# Patient Record
Sex: Female | Born: 1950 | Race: Black or African American | Hispanic: No | Marital: Married | Smoking: Former smoker
Health system: Southern US, Community
[De-identification: ages and names within clinical notes are randomized; demographics above are authoritative.]

## PROBLEM LIST (undated history)

## (undated) DIAGNOSIS — E119 Type 2 diabetes mellitus without complications: Secondary | ICD-10-CM

## (undated) DIAGNOSIS — I10 Essential (primary) hypertension: Secondary | ICD-10-CM

## (undated) HISTORY — PX: BREAST REDUCTION SURGERY: SHX8

## (undated) HISTORY — PX: ABDOMINAL HYSTERECTOMY: SHX81

## (undated) HISTORY — PX: BACK SURGERY: SHX140

## (undated) HISTORY — PX: OTHER SURGICAL HISTORY: SHX169

---

## 1998-09-23 ENCOUNTER — Encounter: Payer: Self-pay | Admitting: Family Medicine

## 1998-09-23 ENCOUNTER — Ambulatory Visit (HOSPITAL_COMMUNITY): Admission: RE | Admit: 1998-09-23 | Discharge: 1998-09-23 | Payer: Self-pay | Admitting: Family Medicine

## 1999-09-08 ENCOUNTER — Encounter: Payer: Self-pay | Admitting: Internal Medicine

## 1999-09-08 ENCOUNTER — Encounter: Admission: RE | Admit: 1999-09-08 | Discharge: 1999-09-08 | Payer: Self-pay | Admitting: Internal Medicine

## 1999-10-05 ENCOUNTER — Other Ambulatory Visit: Admission: RE | Admit: 1999-10-05 | Discharge: 1999-10-05 | Payer: Self-pay | Admitting: Internal Medicine

## 1999-11-03 ENCOUNTER — Ambulatory Visit (HOSPITAL_COMMUNITY): Admission: RE | Admit: 1999-11-03 | Discharge: 1999-11-03 | Payer: Self-pay | Admitting: Gastroenterology

## 1999-11-09 ENCOUNTER — Encounter: Admission: RE | Admit: 1999-11-09 | Discharge: 1999-11-09 | Payer: Self-pay | Admitting: Internal Medicine

## 1999-11-09 ENCOUNTER — Encounter: Payer: Self-pay | Admitting: Internal Medicine

## 2001-06-04 ENCOUNTER — Other Ambulatory Visit: Admission: RE | Admit: 2001-06-04 | Discharge: 2001-06-04 | Payer: Self-pay | Admitting: Family Medicine

## 2001-06-19 ENCOUNTER — Other Ambulatory Visit: Admission: RE | Admit: 2001-06-19 | Discharge: 2001-06-19 | Payer: Self-pay | Admitting: Gynecology

## 2002-12-26 ENCOUNTER — Other Ambulatory Visit: Admission: RE | Admit: 2002-12-26 | Discharge: 2002-12-26 | Payer: Self-pay | Admitting: Obstetrics and Gynecology

## 2003-02-06 ENCOUNTER — Observation Stay (HOSPITAL_COMMUNITY): Admission: RE | Admit: 2003-02-06 | Discharge: 2003-02-07 | Payer: Self-pay | Admitting: Obstetrics and Gynecology

## 2003-11-14 ENCOUNTER — Ambulatory Visit (HOSPITAL_COMMUNITY): Admission: RE | Admit: 2003-11-14 | Discharge: 2003-11-14 | Payer: Self-pay | Admitting: Family Medicine

## 2003-11-21 ENCOUNTER — Ambulatory Visit (HOSPITAL_COMMUNITY): Admission: RE | Admit: 2003-11-21 | Discharge: 2003-11-21 | Payer: Self-pay | Admitting: Family Medicine

## 2003-11-28 ENCOUNTER — Ambulatory Visit (HOSPITAL_COMMUNITY): Admission: RE | Admit: 2003-11-28 | Discharge: 2003-11-28 | Payer: Self-pay | Admitting: Family Medicine

## 2004-05-27 ENCOUNTER — Encounter: Admission: RE | Admit: 2004-05-27 | Discharge: 2004-05-27 | Payer: Self-pay | Admitting: Occupational Medicine

## 2004-06-15 ENCOUNTER — Emergency Department (HOSPITAL_COMMUNITY): Admission: EM | Admit: 2004-06-15 | Discharge: 2004-06-15 | Payer: Self-pay | Admitting: Emergency Medicine

## 2004-06-19 ENCOUNTER — Emergency Department (HOSPITAL_COMMUNITY): Admission: EM | Admit: 2004-06-19 | Discharge: 2004-06-19 | Payer: Self-pay | Admitting: Family Medicine

## 2004-09-07 ENCOUNTER — Ambulatory Visit (HOSPITAL_COMMUNITY): Admission: RE | Admit: 2004-09-07 | Discharge: 2004-09-08 | Payer: Self-pay | Admitting: Neurological Surgery

## 2005-01-18 ENCOUNTER — Encounter: Payer: Self-pay | Admitting: Neurological Surgery

## 2005-02-07 ENCOUNTER — Encounter: Payer: Self-pay | Admitting: Neurological Surgery

## 2005-03-10 ENCOUNTER — Encounter: Payer: Self-pay | Admitting: Neurological Surgery

## 2005-04-07 ENCOUNTER — Encounter: Payer: Self-pay | Admitting: Neurological Surgery

## 2006-01-19 ENCOUNTER — Encounter: Admission: RE | Admit: 2006-01-19 | Discharge: 2006-01-19 | Payer: Self-pay | Admitting: Family Medicine

## 2010-02-27 ENCOUNTER — Encounter: Payer: Self-pay | Admitting: Family Medicine

## 2010-02-28 ENCOUNTER — Encounter: Payer: Self-pay | Admitting: Neurological Surgery

## 2010-03-01 ENCOUNTER — Other Ambulatory Visit (HOSPITAL_COMMUNITY): Payer: Self-pay | Admitting: Cardiology

## 2010-03-19 ENCOUNTER — Encounter (HOSPITAL_COMMUNITY): Payer: Self-pay

## 2010-04-08 ENCOUNTER — Other Ambulatory Visit (HOSPITAL_COMMUNITY): Payer: Self-pay | Admitting: Cardiology

## 2010-04-29 ENCOUNTER — Other Ambulatory Visit (HOSPITAL_COMMUNITY): Payer: Self-pay

## 2010-04-30 ENCOUNTER — Other Ambulatory Visit (HOSPITAL_COMMUNITY): Payer: Self-pay | Admitting: Cardiology

## 2010-05-28 ENCOUNTER — Ambulatory Visit (HOSPITAL_COMMUNITY): Payer: Self-pay

## 2010-05-28 ENCOUNTER — Ambulatory Visit (HOSPITAL_COMMUNITY)
Admission: RE | Admit: 2010-05-28 | Discharge: 2010-05-28 | Disposition: A | Discharge status: Home / Self Care | Payer: Medicare Other | Source: Ambulatory Visit | Attending: Cardiology | Admitting: Cardiology

## 2010-05-28 ENCOUNTER — Encounter (HOSPITAL_COMMUNITY)
Admission: RE | Admit: 2010-05-28 | Discharge: 2010-05-28 | Disposition: A | Discharge status: Home / Self Care | Payer: Medicare Other | Source: Ambulatory Visit | Attending: Cardiology | Admitting: Cardiology

## 2010-05-28 DIAGNOSIS — I4949 Other premature depolarization: Secondary | ICD-10-CM | POA: Insufficient documentation

## 2010-05-28 DIAGNOSIS — I498 Other specified cardiac arrhythmias: Secondary | ICD-10-CM | POA: Insufficient documentation

## 2010-05-28 DIAGNOSIS — R0602 Shortness of breath: Secondary | ICD-10-CM | POA: Insufficient documentation

## 2010-05-28 DIAGNOSIS — R002 Palpitations: Secondary | ICD-10-CM | POA: Insufficient documentation

## 2010-05-28 DIAGNOSIS — R079 Chest pain, unspecified: Secondary | ICD-10-CM | POA: Insufficient documentation

## 2010-05-28 DIAGNOSIS — I4510 Unspecified right bundle-branch block: Secondary | ICD-10-CM | POA: Insufficient documentation

## 2010-05-28 LAB — HEPATIC FUNCTION PANEL
ALT: 26 U/L (ref 0–35)
Bilirubin, Direct: 0.5 mg/dL — ABNORMAL HIGH (ref 0.0–0.3)
Indirect Bilirubin: 0.2 mg/dL — ABNORMAL LOW (ref 0.3–0.9)
Total Protein: 7.3 g/dL (ref 6.0–8.3)

## 2010-05-28 LAB — LIPID PANEL
HDL: 32 mg/dL — ABNORMAL LOW (ref >39)
LDL Cholesterol: 70 mg/dL (ref 0–99)
Triglycerides: 141 mg/dL (ref <150)

## 2010-05-28 LAB — BASIC METABOLIC PANEL
Creatinine, Ser: 0.65 mg/dL (ref 0.4–1.2)
GFR calc non Af Amer: >60 mL/min (ref >60)
Glucose, Bld: 132 mg/dL — ABNORMAL HIGH (ref 70–99)

## 2010-05-28 LAB — MAGNESIUM: Magnesium: 2.1 mg/dL (ref 1.5–2.5)

## 2010-05-28 MED ORDER — TECHNETIUM TC 99M TETROFOSMIN IV KIT
30.0000 | PACK | Freq: Once | INTRAVENOUS | Status: AC | PRN
  Administered 2010-05-28: 30 via INTRAVENOUS

## 2010-05-28 MED ORDER — TECHNETIUM TC 99M TETROFOSMIN IV KIT
10.0000 | PACK | Freq: Once | INTRAVENOUS | Status: AC | PRN
  Administered 2010-05-28: 10 via INTRAVENOUS

## 2011-02-12 ENCOUNTER — Other Ambulatory Visit: Payer: Self-pay | Admitting: Cardiology

## 2013-08-06 ENCOUNTER — Emergency Department (HOSPITAL_COMMUNITY): Payer: No Typology Code available for payment source

## 2013-08-06 ENCOUNTER — Encounter (HOSPITAL_COMMUNITY): Payer: Self-pay | Admitting: Emergency Medicine

## 2013-08-06 ENCOUNTER — Emergency Department (HOSPITAL_COMMUNITY)
Admission: EM | Admit: 2013-08-06 | Discharge: 2013-08-06 | Disposition: A | Discharge status: Home / Self Care | Payer: No Typology Code available for payment source | Attending: Emergency Medicine | Admitting: Emergency Medicine

## 2013-08-06 DIAGNOSIS — M545 Low back pain: Secondary | ICD-10-CM

## 2013-08-06 DIAGNOSIS — S139XXA Sprain of joints and ligaments of unspecified parts of neck, initial encounter: Secondary | ICD-10-CM | POA: Insufficient documentation

## 2013-08-06 DIAGNOSIS — Y92410 Unspecified street and highway as the place of occurrence of the external cause: Secondary | ICD-10-CM | POA: Insufficient documentation

## 2013-08-06 DIAGNOSIS — R51 Headache: Secondary | ICD-10-CM | POA: Insufficient documentation

## 2013-08-06 DIAGNOSIS — E119 Type 2 diabetes mellitus without complications: Secondary | ICD-10-CM | POA: Insufficient documentation

## 2013-08-06 DIAGNOSIS — Z87891 Personal history of nicotine dependence: Secondary | ICD-10-CM | POA: Insufficient documentation

## 2013-08-06 DIAGNOSIS — Y9389 Activity, other specified: Secondary | ICD-10-CM | POA: Insufficient documentation

## 2013-08-06 DIAGNOSIS — I10 Essential (primary) hypertension: Secondary | ICD-10-CM | POA: Insufficient documentation

## 2013-08-06 DIAGNOSIS — IMO0002 Reserved for concepts with insufficient information to code with codable children: Secondary | ICD-10-CM | POA: Insufficient documentation

## 2013-08-06 HISTORY — DX: Essential (primary) hypertension: I10

## 2013-08-06 HISTORY — DX: Type 2 diabetes mellitus without complications: E11.9

## 2013-08-06 MED ORDER — OXYCODONE-ACETAMINOPHEN 5-325 MG PO TABS: 1.0000 | ORAL_TABLET | Freq: Four times a day (QID) | ORAL | Status: AC | PRN

## 2013-08-06 MED ORDER — OXYCODONE-ACETAMINOPHEN 5-325 MG PO TABS
2.0000 | ORAL_TABLET | Freq: Once | ORAL | Status: AC
  Administered 2013-08-06: 2 via ORAL
  Filled 2013-08-06: qty 2

## 2013-08-06 MED ORDER — ONDANSETRON HCL 4 MG PO TABS: 4.0000 mg | ORAL_TABLET | Freq: Four times a day (QID) | ORAL | Status: DC

## 2013-08-06 MED ORDER — ONDANSETRON 8 MG PO TBDP
8.0000 mg | ORAL_TABLET | Freq: Once | ORAL | Status: AC
  Administered 2013-08-06: 8 mg via ORAL
  Filled 2013-08-06: qty 1
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
## 2013-08-06 NOTE — ED Notes (Signed)
Pt on backboard on arrival to ED.
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
## 2013-08-06 NOTE — ED Notes (Signed)
Notified Dr. Lynelle DoctorKnapp, pt on spine board.  Per pt hx, waiting for MD to remove. Pt notified of delay and plan

## 2013-08-06 NOTE — ED Notes (Signed)
Pt able to ambulate prior to dc

## 2013-08-08 NOTE — ED Provider Notes (Signed)
Medical screening examination/treatment/procedure(s) were performed by non-physician practitioner and as supervising physician I was immediately available for consultation/collaboration.   EKG Interpretation None       Ankit Nanavati, MD 08/08/13 0646 

## 2014-02-24 ENCOUNTER — Emergency Department (HOSPITAL_COMMUNITY)
Admission: EM | Admit: 2014-02-24 | Discharge: 2014-02-25 | Payer: Medicare Other | Attending: Emergency Medicine | Admitting: Emergency Medicine

## 2014-02-24 ENCOUNTER — Encounter (HOSPITAL_COMMUNITY): Payer: Self-pay | Admitting: Emergency Medicine

## 2014-02-24 DIAGNOSIS — I10 Essential (primary) hypertension: Secondary | ICD-10-CM | POA: Insufficient documentation

## 2014-02-24 DIAGNOSIS — F1310 Sedative, hypnotic or anxiolytic abuse, uncomplicated: Secondary | ICD-10-CM | POA: Insufficient documentation

## 2014-02-24 DIAGNOSIS — Z79899 Other long term (current) drug therapy: Secondary | ICD-10-CM | POA: Insufficient documentation

## 2014-02-24 DIAGNOSIS — Z91040 Latex allergy status: Secondary | ICD-10-CM | POA: Diagnosis not present

## 2014-02-24 DIAGNOSIS — F209 Schizophrenia, unspecified: Secondary | ICD-10-CM | POA: Insufficient documentation

## 2014-02-24 DIAGNOSIS — E119 Type 2 diabetes mellitus without complications: Secondary | ICD-10-CM | POA: Diagnosis not present

## 2014-02-24 DIAGNOSIS — Z87891 Personal history of nicotine dependence: Secondary | ICD-10-CM | POA: Insufficient documentation

## 2014-02-24 DIAGNOSIS — F3113 Bipolar disorder, current episode manic without psychotic features, severe: Secondary | ICD-10-CM | POA: Diagnosis not present

## 2014-02-24 DIAGNOSIS — F203 Undifferentiated schizophrenia: Secondary | ICD-10-CM

## 2014-02-24 DIAGNOSIS — Z008 Encounter for other general examination: Secondary | ICD-10-CM | POA: Diagnosis present

## 2014-02-24 LAB — COMPREHENSIVE METABOLIC PANEL
ALT: 25 U/L (ref 0–35)
AST: 22 U/L (ref 0–37)
Albumin: 4.9 g/dL (ref 3.5–5.2)
Alkaline Phosphatase: 88 U/L (ref 39–117)
Anion gap: 10 (ref 5–15)
BUN: 17 mg/dL (ref 6–23)
CALCIUM: 9.9 mg/dL (ref 8.4–10.5)
CHLORIDE: 100 meq/L (ref 96–112)
CO2: 26 mmol/L (ref 19–32)
Creatinine, Ser: 0.9 mg/dL (ref 0.50–1.10)
GFR calc Af Amer: 77 mL/min — ABNORMAL LOW (ref >90)
GFR calc non Af Amer: 67 mL/min — ABNORMAL LOW (ref >90)
Glucose, Bld: 127 mg/dL — ABNORMAL HIGH (ref 70–99)
Potassium: 3.1 mmol/L — ABNORMAL LOW (ref 3.5–5.1)
SODIUM: 136 mmol/L (ref 135–145)
Total Bilirubin: 0.6 mg/dL (ref 0.3–1.2)
Total Protein: 8.3 g/dL (ref 6.0–8.3)

## 2014-02-24 LAB — CBC
HCT: 34.8 % — ABNORMAL LOW (ref 36.0–46.0)
Hemoglobin: 11.5 g/dL — ABNORMAL LOW (ref 12.0–15.0)
MCH: 30.2 pg (ref 26.0–34.0)
MCHC: 33 g/dL (ref 30.0–36.0)
MCV: 91.3 fL (ref 78.0–100.0)
Platelets: 424 10*3/uL — ABNORMAL HIGH (ref 150–400)
RBC: 3.81 MIL/uL — AB (ref 3.87–5.11)
RDW: 13.7 % (ref 11.5–15.5)
WBC: 9.9 10*3/uL (ref 4.0–10.5)

## 2014-02-24 LAB — RAPID URINE DRUG SCREEN, HOSP PERFORMED
Amphetamines: NOT DETECTED
Barbiturates: NOT DETECTED
Benzodiazepines: POSITIVE — AB
Cocaine: NOT DETECTED
Opiates: NOT DETECTED
TETRAHYDROCANNABINOL: NOT DETECTED

## 2014-02-24 LAB — SALICYLATE LEVEL: Salicylate Lvl: <4 mg/dL (ref 2.8–20.0)

## 2014-02-24 LAB — ETHANOL: Alcohol, Ethyl (B): <5 mg/dL (ref 0–9)

## 2014-02-24 LAB — ACETAMINOPHEN LEVEL: Acetaminophen (Tylenol), Serum: <10 ug/mL — ABNORMAL LOW (ref 10–30)

## 2014-02-24 MED ORDER — POTASSIUM CHLORIDE CRYS ER 20 MEQ PO TBCR
40.0000 meq | EXTENDED_RELEASE_TABLET | Freq: Once | ORAL | Status: AC
  Administered 2014-02-24: 40 meq via ORAL
  Filled 2014-02-24: qty 2

## 2014-02-24 MED ORDER — BUPROPION HCL ER (XL) 300 MG PO TB24
300.0000 mg | ORAL_TABLET | Freq: Every day | ORAL | Status: DC
  Administered 2014-02-25: 300 mg via ORAL
  Filled 2014-02-24: qty 1

## 2014-02-24 MED ORDER — GABAPENTIN 300 MG PO CAPS
300.0000 mg | ORAL_CAPSULE | Freq: Two times a day (BID) | ORAL | Status: DC
  Administered 2014-02-24 – 2014-02-25 (×2): 300 mg via ORAL
  Filled 2014-02-24 (×2): qty 1

## 2014-02-24 MED ORDER — IBUPROFEN 200 MG PO TABS: 600.0000 mg | ORAL_TABLET | Freq: Three times a day (TID) | ORAL | Status: DC | PRN

## 2014-02-24 MED ORDER — PROPRANOLOL HCL ER 120 MG PO CP24
120.0000 mg | ORAL_CAPSULE | Freq: Every day | ORAL | Status: DC
  Filled 2014-02-24: qty 1

## 2014-02-24 MED ORDER — ACETAMINOPHEN 325 MG PO TABS
650.0000 mg | ORAL_TABLET | ORAL | Status: DC | PRN
  Administered 2014-02-25: 650 mg via ORAL
  Filled 2014-02-24: qty 2

## 2014-02-24 MED ORDER — LORAZEPAM 1 MG PO TABS
1.0000 mg | ORAL_TABLET | Freq: Three times a day (TID) | ORAL | Status: DC | PRN
  Administered 2014-02-24 – 2014-02-25 (×2): 1 mg via ORAL
  Filled 2014-02-24 (×2): qty 1

## 2014-02-24 MED ORDER — LOSARTAN POTASSIUM-HCTZ 100-25 MG PO TABS: 1.0000 | ORAL_TABLET | Freq: Every day | ORAL | Status: DC

## 2014-02-24 MED ORDER — ATORVASTATIN CALCIUM 80 MG PO TABS
80.0000 mg | ORAL_TABLET | Freq: Every day | ORAL | Status: DC
  Administered 2014-02-25: 80 mg via ORAL
  Filled 2014-02-24: qty 1

## 2014-02-24 MED ORDER — HYDROCHLOROTHIAZIDE 25 MG PO TABS
25.0000 mg | ORAL_TABLET | Freq: Every day | ORAL | Status: DC
  Filled 2014-02-24: qty 1

## 2014-02-24 MED ORDER — LOSARTAN POTASSIUM 50 MG PO TABS
100.0000 mg | ORAL_TABLET | Freq: Every day | ORAL | Status: DC
  Filled 2014-02-24: qty 2

## 2014-02-24 MED ORDER — METFORMIN HCL 500 MG PO TABS
500.0000 mg | ORAL_TABLET | Freq: Two times a day (BID) | ORAL | Status: DC
  Administered 2014-02-25 (×2): 500 mg via ORAL
  Filled 2014-02-24 (×3): qty 1
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
## 2014-02-24 NOTE — ED Notes (Signed)
Pt in with tele psych at this time.

## 2014-02-24 NOTE — ED Provider Notes (Signed)
CSN: [id number]     Arrival date & time 02/24/14  4098 History   First MD Initiated Contact with Patient 02/24/14 2013     Chief Complaint  Patient presents with  . Medical Clearance     (Consider location/radiation/quality/duration/timing/severity/associated sxs/prior Treatment) The history is provided by the patient.  Katherine Haas is a 64 y.o. female hx of HTN, DM, bipolar here with possible psychosis. She has been having some problem with her husband and they have been married for 40 years. She thinks that he has been sleeping with other family members. She states that she has been angry at him. She also has been "looking into hives of dead people" and sees people who are dead. Denies any other hallucinations. She stopped taking her psychiatric medicines about the last 6 months. She had previous psych admissions. Brought in by mobile crisis.     Past Medical History  Diagnosis Date  . Hypertension   . Diabetes mellitus without complication    Past Surgical History  Procedure Laterality Date  . Back surgery    . Cyst removed    . Abdominal hysterectomy    . Breast reduction surgery     No family history on file. History  Substance Use Topics  . Smoking status: Former Games developer  . Smokeless tobacco: Not on file  . Alcohol Use: No   OB History    No data available     Review of Systems  Psychiatric/Behavioral: Positive for behavioral problems and agitation.  All other systems reviewed and are negative.     Allergies  Latex  Home Medications   Prior to Admission medications   Medication Sig Start Date End Date Taking? Authorizing Provider  atorvastatin (LIPITOR) 80 MG tablet Take 80 mg by mouth daily.   Yes Historical Provider, MD  buPROPion (WELLBUTRIN XL) 300 MG 24 hr tablet Take 300 mg by mouth daily.   Yes Historical Provider, MD  gabapentin (NEURONTIN) 300 MG capsule Take 300 mg by mouth 2 (two) times daily.   Yes Historical Provider, MD   losartan-hydrochlorothiazide (HYZAAR) 100-25 MG per tablet Take 1 tablet by mouth daily.   Yes Historical Provider, MD  metFORMIN (GLUCOPHAGE) 500 MG tablet Take 500 mg by mouth 2 (two) times daily with a meal.   Yes Historical Provider, MD  ondansetron (ZOFRAN) 4 MG tablet Take 1 tablet (4 mg total) by mouth every 6 (six) hours. 08/06/13  Yes Mora Bellman, PA-C  propranolol ER (INDERAL LA) 120 MG 24 hr capsule Take 120 mg by mouth daily.   Yes Historical Provider, MD  oxyCODONE-acetaminophen (PERCOCET/ROXICET) 5-325 MG per tablet Take 1-2 tablets by mouth every 6 (six) hours as needed for moderate pain or severe pain. 08/06/13   Mora Bellman, PA-C  Vitamin D, Ergocalciferol, (DRISDOL) 50000 UNITS CAPS capsule Take 50,000 Units by mouth every 7 (seven) days.    Historical Provider, MD   BP 119/54 mmHg  Pulse 84  Temp(Src) 97.9 F (36.6 C) (Oral)  Resp 16  SpO2 100% Physical Exam  Constitutional: She is oriented to person, place, and time.  Agitated   HENT:  Head: Normocephalic.  Mouth/Throat: Oropharynx is clear and moist.  Eyes: Conjunctivae are normal. Pupils are equal, round, and reactive to light.  Neck: Normal range of motion. Neck supple.  Cardiovascular: Normal rate, regular rhythm and normal heart sounds.   Pulmonary/Chest: Effort normal and breath sounds normal. No respiratory distress. She has no wheezes. She has no rales.  Abdominal: Soft. Bowel sounds are normal. She exhibits no distension. There is no tenderness. There is no rebound and no guarding.  Musculoskeletal: Normal range of motion.  Neurological: She is alert and oriented to person, place, and time.  Skin: Skin is warm and dry.  Psychiatric:  Agitated, withdrawn. Poor judgement   Nursing note and vitals reviewed.   ED Course  Procedures (including critical care time) Labs Review Labs Reviewed  ACETAMINOPHEN LEVEL - Abnormal; Notable for the following:    Acetaminophen (Tylenol), Serum <10.0 (*)     All other components within normal limits  CBC - Abnormal; Notable for the following:    RBC 3.81 (*)    Hemoglobin 11.5 (*)    HCT 34.8 (*)    Platelets 424 (*)    All other components within normal limits  COMPREHENSIVE METABOLIC PANEL - Abnormal; Notable for the following:    Potassium 3.1 (*)    Glucose, Bld 127 (*)    GFR calc non Af Amer 67 (*)    GFR calc Af Amer 77 (*)    All other components within normal limits  ETHANOL  SALICYLATE LEVEL  URINE RAPID DRUG SCREEN (HOSP PERFORMED)    Imaging Review No results found.   EKG Interpretation None      MDM   Final diagnoses:  None    Katherine PlumpLinda A Haas is a 64 y.o. female here with agitation, psychosis, off meds. Will have psych evaluate patient.   10:17 PM Psych saw patient. Recommend inpatient admission. K 3.1, supplemented. Medically cleared     Richardean Canalavid H Jad Johansson, MD 02/24/14 2217
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
## 2014-02-24 NOTE — BH Assessment (Addendum)
Inpt recommended. No appropriate [facility]. Sent referrals to: [facility] per Angelique Blonderenise at capacity tonight but placed in system for consideration, as there are AM discharges.  [facility] - on wait list [place], [facility]  Clista BernhardtNancy Caylea [place]onda, [place] Triage Specialist 02/24/2014 11:03 PM
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
## 2014-02-25 ENCOUNTER — Inpatient Hospital Stay: Payer: Self-pay | Admitting: Psychiatry

## 2014-02-25 DIAGNOSIS — F3113 Bipolar disorder, current episode manic without psychotic features, severe: Secondary | ICD-10-CM

## 2014-02-25 NOTE — ED Notes (Signed)
Husband brought big black roller duffle bag with patient clothing in it. Was to big to fit in hallway closet and patients lockers,the bag is behind the nurses station

## 2014-02-25 NOTE — Progress Notes (Signed)
Pt's IVC document completed. Spoke to charge RN from [facility] Wendy, who stated that nursing report be called after shift change. Writer called [employer] office phone and left a message for pickup from [facility] to be [facility]. Oncoming RN informed of pt's status and updates given via shift report.
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
## 2014-02-25 NOTE — ED Notes (Signed)
Patient denies SI, HI, AVH. Reports mild anxiety r/t being in the hospital and being sent to [place] instead of going home. Rates feelings of depression 7/10. States that sleep has been poor in recent weeks. Patient reports forcing herself to eat r/t decreased appetite. Weight loss of approximately 5 lbs.  Encouragement offered.   Q 15 safety checks in place.

## 2014-02-25 NOTE — BH Assessment (Signed)
[facility] Assessment Progress Note   A second order for TTS consult was submitted but the original has already been completed.  Second order for TTS consult is unnecessary.

## 2014-02-25 NOTE — ED Notes (Signed)
Called [facility] [place] to give report. Nursing staff passing meds, asked to call back around 2150.
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
## 2014-02-25 NOTE — Progress Notes (Signed)
CSW faxed IVC paperwork to [facility].  Crista CurbBrittney Pleasant Britz, [place] [phone] ED CSW 02/25/2014 6:46 PM .

## 2014-02-25 NOTE — BH Assessment (Signed)
[facility] Assessment Progress Note  At 13:58 I [facility] to [facility].  Pt has been accepted to their facility by Dr Jennet MaduroPucilowska to Rm 322.  Please call report to [phone].  The charge nurse at [facility] today is Toniann FailWendy.  [facility].  Doylene Canninghomas Deshonda Cryderman, MA Triage Specialist 02/26/2104 @ 13:59

## 2014-02-28 LAB — COMPREHENSIVE METABOLIC PANEL
Albumin: 3.6 g/dL (ref 3.4–5.0)
Alkaline Phosphatase: 82 U/L
Anion Gap: 7 (ref 7–16)
BILIRUBIN TOTAL: 0.2 mg/dL (ref 0.2–1.0)
BUN: 16 mg/dL (ref 7–18)
CHLORIDE: 109 mmol/L — AB (ref 98–107)
CO2: 26 mmol/L (ref 21–32)
Calcium, Total: 9.4 mg/dL (ref 8.5–10.1)
Creatinine: 0.82 mg/dL (ref 0.60–1.30)
EGFR (African American): >60
Glucose: 96 mg/dL (ref 65–99)
OSMOLALITY: 284 (ref 275–301)
POTASSIUM: 3.9 mmol/L (ref 3.5–5.1)
SGOT(AST): 34 U/L (ref 15–37)
SGPT (ALT): 42 U/L
SODIUM: 142 mmol/L (ref 136–145)
Total Protein: 7.1 g/dL (ref 6.4–8.2)

## 2014-02-28 LAB — HEMOGLOBIN A1C: Hemoglobin A1C: 6.6 % — ABNORMAL HIGH (ref 4.2–6.3)

## 2014-02-28 LAB — LIPID PANEL
Cholesterol: 95 mg/dL (ref 0–200)
HDL: 33 mg/dL — AB (ref 40–60)
LDL CHOLESTEROL, CALC: 41 mg/dL (ref 0–100)
TRIGLYCERIDES: 106 mg/dL (ref 0–200)
VLDL Cholesterol, Calc: 21 mg/dL (ref 5–40)

## 2014-02-28 LAB — TSH: Thyroid Stimulating Horm: 0.756 u[IU]/mL

## 2014-03-03 LAB — VALPROIC ACID LEVEL: VALPROIC ACID: 112 ug/mL — AB
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
## 2016-12-16 ENCOUNTER — Encounter (HOSPITAL_COMMUNITY): Payer: Self-pay | Admitting: Emergency Medicine

## 2016-12-16 ENCOUNTER — Other Ambulatory Visit: Payer: Self-pay

## 2016-12-16 ENCOUNTER — Ambulatory Visit (HOSPITAL_COMMUNITY)
Admission: EM | Admit: 2016-12-16 | Discharge: 2016-12-16 | Disposition: A | Discharge status: Home / Self Care | Payer: Medicare HMO | Attending: Emergency Medicine | Admitting: Emergency Medicine

## 2016-12-16 DIAGNOSIS — H9202 Otalgia, left ear: Secondary | ICD-10-CM

## 2016-12-16 DIAGNOSIS — T162XXA Foreign body in left ear, initial encounter: Secondary | ICD-10-CM

## 2016-12-16 MED ORDER — AMOXICILLIN 500 MG PO CAPS: 500.0000 mg | ORAL_CAPSULE | Freq: Three times a day (TID) | ORAL | 0 refills | Status: AC

## 2016-12-16 NOTE — Discharge Instructions (Signed)
Avoid sticking anything in the ear  May use warm water to try loosing anything in the ear Call ENT on Monday to be seen  Placing on an abx due to its a foreign body and may begin to cause infection

## 2016-12-16 NOTE — ED Provider Notes (Signed)
[facility]    CSN: [id number] Arrival date & time: 12/16/16  1332     History   Chief Complaint Chief Complaint  Patient presents with  . Ear Problem    HPI Katherine Haas is a 66 y.o. female.   Felt her lt ear itching and causing pain,. This am stuck a q tip in the ear. Per pt the tip of it broke off inside the ear. Pt attempted to get the cotton and so did her daughter. Pt states that it is painful at times can hear out of the ear. Did not take anything pta       Past Medical History:  Diagnosis Date  . Diabetes mellitus without complication (HCC)   . Hypertension     Patient Active Problem List   Diagnosis Date Noted  . Bipolar disorder, current episode manic without psychotic features, severe (HCC)     Past Surgical History:  Procedure Laterality Date  . ABDOMINAL HYSTERECTOMY    . BACK SURGERY    . BREAST REDUCTION SURGERY    . cyst removed      OB History    No data available       Home Medications    Prior to Admission medications   Medication Sig Start Date End Date Taking? Authorizing Provider  amoxicillin (AMOXIL) 500 MG capsule Take 1 capsule (500 mg total) 3 (three) times daily by mouth. 12/16/16   Coralyn MarkMitchell, Berna Gitto L, NP  atorvastatin (LIPITOR) 80 MG tablet Take 80 mg by mouth daily.    [provider]  buPROPion (WELLBUTRIN XL) 300 MG 24 hr tablet Take 300 mg by mouth daily.    [provider]  gabapentin (NEURONTIN) 300 MG capsule Take 300 mg by mouth 2 (two) times daily.    [provider]  losartan-hydrochlorothiazide (HYZAAR) 100-25 MG per tablet Take 1 tablet by mouth daily.    [provider]  metFORMIN (GLUCOPHAGE) 500 MG tablet Take 500 mg by mouth 2 (two) times daily with a meal.    [provider]  oxyCODONE-acetaminophen (PERCOCET/ROXICET) 5-325 MG per tablet Take 1-2 tablets by mouth every 6 (six) hours as needed for moderate pain or severe pain. 08/06/13   Junious SilkMerrell, Hannah,  PA-C  propranolol ER (INDERAL LA) 120 MG 24 hr capsule Take 120 mg by mouth daily.    [provider]  Vitamin D, Ergocalciferol, (DRISDOL) 50000 UNITS CAPS capsule Take 50,000 Units by mouth every 7 (seven) days.    [provider]    Family History No family history on file.  Social History Social History   Tobacco Use  . Smoking status: Former Smoker  Substance Use Topics  . Alcohol use: No  . Drug use: No     Allergies   Latex   Review of Systems Review of Systems  Constitutional: Negative.   HENT: Positive for ear pain.   Eyes: Negative.   Respiratory: Negative.   Cardiovascular: Negative.   Gastrointestinal: Negative.   Musculoskeletal: Negative.   Skin: Negative.   Neurological: Negative.      Physical Exam Triage Vital Signs ED Triage Vitals [12/16/16 1407]  Enc Vitals Group     BP (!) 166/68     Pulse Rate 75     Resp 18     Temp 97.9 F (36.6 C)     Temp src      SpO2 100 %     Weight      Height  Head Circumference      Peak Flow      Pain Score 2     Pain Loc      Pain Edu?      Excl. in GC?    No data found.  Updated Vital Signs BP (!) 166/68   Pulse 75   Temp 97.9 F (36.6 C)   Resp 18   SpO2 100%   Visual Acuity Right Eye Distance:   Left Eye Distance:   Bilateral Distance:    Right Eye Near:   Left Eye Near:    Bilateral Near:     Physical Exam  Constitutional: She appears well-developed.  HENT:  Right Ear: External ear normal.  Nose: Nose normal.  Mouth/Throat: Oropharynx is clear and moist.  Erythema, bulding TM, did not visualize any q tip, no cotton,   Eyes: Pupils are equal, round, and reactive to light.  Neck: Normal range of motion.  Cardiovascular: Normal rate.  Pulmonary/Chest: Effort normal.  Neurological: She is alert.  Skin: Skin is warm.     UC Treatments / Results  Labs (all labs ordered are listed, but only abnormal results are displayed) Labs Reviewed - No data to  display  EKG  EKG Interpretation None       Radiology No results found.  Procedures Procedures (including critical care time)  Medications Ordered in UC Medications - No data to display   Initial Impression / Assessment and Plan / UC Course  I have reviewed the triage vital signs and the nursing notes.  Pertinent labs & imaging results that were available during my care of the patient were reviewed by me and considered in my medical decision making (see chart for details).     Avoid sticking anything in the ear  May use warm water to try loosing anything in the ear Call ENT on Monday to be seen  Placing on an abx due to its a foreign body and may begin to cause infection  PA Amy also looked inside the ear and was not able to see any foreign body  Final Clinical Impressions(s) / UC Diagnoses   Final diagnoses:  Ear foreign body, left, initial encounter    ED Discharge Orders        Ordered    amoxicillin (AMOXIL) 500 MG capsule  3 times daily     12/16/16 1444       Controlled Substance Prescriptions [place] Controlled Substance Registry consulted? Not Applicable   Coralyn MarkMitchell, Mikyah Alamo L, NP 12/16/16 1502

## 2016-12-16 NOTE — ED Triage Notes (Signed)
Pt states she has a cotton ball stuck in her L ear since this morning.

## 2018-01-23 ENCOUNTER — Encounter (HOSPITAL_COMMUNITY): Payer: Self-pay

## 2018-01-23 ENCOUNTER — Emergency Department (HOSPITAL_COMMUNITY)
Admission: EM | Admit: 2018-01-23 | Discharge: 2018-01-23 | Disposition: A | Discharge status: Home / Self Care | Payer: Medicare HMO | Attending: Emergency Medicine | Admitting: Emergency Medicine

## 2018-01-23 ENCOUNTER — Emergency Department (HOSPITAL_COMMUNITY): Payer: Medicare HMO

## 2018-01-23 DIAGNOSIS — R0789 Other chest pain: Secondary | ICD-10-CM | POA: Insufficient documentation

## 2018-01-23 DIAGNOSIS — R1031 Right lower quadrant pain: Secondary | ICD-10-CM | POA: Diagnosis not present

## 2018-01-23 DIAGNOSIS — I10 Essential (primary) hypertension: Secondary | ICD-10-CM | POA: Diagnosis not present

## 2018-01-23 DIAGNOSIS — Z79899 Other long term (current) drug therapy: Secondary | ICD-10-CM | POA: Insufficient documentation

## 2018-01-23 DIAGNOSIS — E119 Type 2 diabetes mellitus without complications: Secondary | ICD-10-CM | POA: Diagnosis not present

## 2018-01-23 DIAGNOSIS — Z7984 Long term (current) use of oral hypoglycemic drugs: Secondary | ICD-10-CM | POA: Diagnosis not present

## 2018-01-23 DIAGNOSIS — Z87891 Personal history of nicotine dependence: Secondary | ICD-10-CM | POA: Insufficient documentation

## 2018-01-23 DIAGNOSIS — M542 Cervicalgia: Secondary | ICD-10-CM | POA: Insufficient documentation

## 2018-01-23 DIAGNOSIS — R110 Nausea: Secondary | ICD-10-CM | POA: Insufficient documentation

## 2018-01-23 LAB — COMPREHENSIVE METABOLIC PANEL
ALBUMIN: 4.5 g/dL (ref 3.5–5.0)
ALK PHOS: 68 U/L (ref 38–126)
ALT: 42 U/L (ref 0–44)
ANION GAP: 11 (ref 5–15)
AST: 32 U/L (ref 15–41)
BUN: 17 mg/dL (ref 8–23)
CO2: 22 mmol/L (ref 22–32)
CREATININE: 1.08 mg/dL — AB (ref 0.44–1.00)
Calcium: 10.2 mg/dL (ref 8.9–10.3)
Chloride: 105 mmol/L (ref 98–111)
GFR calc Af Amer: >60 mL/min (ref >60)
GFR calc non Af Amer: 53 mL/min — ABNORMAL LOW (ref >60)
Glucose, Bld: 90 mg/dL (ref 70–99)
Potassium: 4 mmol/L (ref 3.5–5.1)
Sodium: 138 mmol/L (ref 135–145)
TOTAL PROTEIN: 7.6 g/dL (ref 6.5–8.1)
Total Bilirubin: 0.5 mg/dL (ref 0.3–1.2)

## 2018-01-23 LAB — CBC WITH DIFFERENTIAL/PLATELET
Abs Immature Granulocytes: 0.02 10*3/uL (ref 0.00–0.07)
BASOS PCT: 1 %
Basophils Absolute: 0.1 10*3/uL (ref 0.0–0.1)
EOS PCT: 2 %
Eosinophils Absolute: 0.1 10*3/uL (ref 0.0–0.5)
HCT: 37.8 % (ref 36.0–46.0)
Hemoglobin: 11.9 g/dL — ABNORMAL LOW (ref 12.0–15.0)
Immature Granulocytes: 0 %
Lymphocytes Relative: 41 %
Lymphs Abs: 3.1 10*3/uL (ref 0.7–4.0)
MCH: 29.7 pg (ref 26.0–34.0)
MCHC: 31.5 g/dL (ref 30.0–36.0)
MCV: 94.3 fL (ref 80.0–100.0)
MONO ABS: 0.6 10*3/uL (ref 0.1–1.0)
Monocytes Relative: 8 %
Neutro Abs: 3.6 10*3/uL (ref 1.7–7.7)
Neutrophils Relative %: 48 %
PLATELETS: 338 10*3/uL (ref 150–400)
RBC: 4.01 MIL/uL (ref 3.87–5.11)
RDW: 13.2 % (ref 11.5–15.5)
WBC: 7.5 10*3/uL (ref 4.0–10.5)
nRBC: 0 % (ref 0.0–0.2)

## 2018-01-23 MED ORDER — LIDOCAINE 5 % EX PTCH: 1.0000 | MEDICATED_PATCH | CUTANEOUS | 0 refills | Status: AC

## 2018-01-23 MED ORDER — FENTANYL CITRATE (PF) 100 MCG/2ML IJ SOLN
25.0000 ug | Freq: Once | INTRAMUSCULAR | Status: AC
  Administered 2018-01-23: 25 ug via INTRAVENOUS
  Filled 2018-01-23: qty 2

## 2018-01-23 MED ORDER — ONDANSETRON HCL 4 MG/2ML IJ SOLN
4.0000 mg | Freq: Once | INTRAMUSCULAR | Status: AC
  Administered 2018-01-23: 4 mg via INTRAVENOUS
  Filled 2018-01-23: qty 2

## 2018-01-23 MED ORDER — ONDANSETRON 4 MG PO TBDP: 4.0000 mg | ORAL_TABLET | Freq: Three times a day (TID) | ORAL | 0 refills | Status: AC | PRN

## 2018-01-23 MED ORDER — IOHEXOL 300 MG/ML  SOLN
100.0000 mL | Freq: Once | INTRAMUSCULAR | Status: AC | PRN
  Administered 2018-01-23: 100 mL via INTRAVENOUS

## 2018-01-23 NOTE — Discharge Instructions (Signed)
Expect your soreness to increase over the next 2-3 days. Take it easy, but do not lay around too much as this may make any stiffness worse.  Antiinflammatory medications: Take 600 mg of ibuprofen every 6 hours or 440 mg (over the counter dose) to 500 mg (prescription dose) of naproxen every 12 hours for the next 3 days. After this time, these medications may be used as needed for pain. Take these medications with food to avoid upset stomach. Choose only one of these medications, do not take them together. Acetaminophen (generic for Tylenol): Should you continue to have additional pain while taking the ibuprofen or naproxen, you may add in acetaminophen as needed. Your daily total maximum amount of acetaminophen from all sources should be limited to 4000mg /day for persons without liver problems, or 2000mg /day for those with liver problems. Lidocaine patches: These are available via either prescription or over-the-counter. The over-the-counter option may be more economical one and are likely just as effective. There are multiple over-the-counter brands, such as Salonpas. Zofran: May use the Zofran, as needed, for nausea. Exercises: Be sure to perform the attached exercises starting with three times a week and working up to performing them daily. This is an essential part of preventing long term problems.  Follow up: Follow up with a primary care provider for any future management of these complaints. Be sure to follow up within 7-10 days. Return: Return to the ED should symptoms worsen.  For prescription assistance, may try using prescription discount sites or apps, such as goodrx.com

## 2018-01-23 NOTE — ED Notes (Signed)
Pt reports pain a headache right now. Pt reports the rest of her areas of pain are no longer hurting.
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
## 2018-01-23 NOTE — ED Notes (Signed)
Patient verbalizes understanding of discharge instructions. Opportunity for questioning and answers were provided. Armband removed by staff, pt discharged from ED home via POV with family. 
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
## 2020-10-08 DIAGNOSIS — K032 Erosion of teeth: Secondary | ICD-10-CM | POA: Diagnosis not present

## 2020-10-15 DIAGNOSIS — H402233 Chronic angle-closure glaucoma, bilateral, severe stage: Secondary | ICD-10-CM | POA: Diagnosis not present

## 2020-11-11 DIAGNOSIS — K032 Erosion of teeth: Secondary | ICD-10-CM | POA: Diagnosis not present

## 2020-11-26 DIAGNOSIS — H402233 Chronic angle-closure glaucoma, bilateral, severe stage: Secondary | ICD-10-CM | POA: Diagnosis not present

## 2020-12-02 DIAGNOSIS — K032 Erosion of teeth: Secondary | ICD-10-CM | POA: Diagnosis not present

## 2021-02-17 DIAGNOSIS — F332 Major depressive disorder, recurrent severe without psychotic features: Secondary | ICD-10-CM | POA: Diagnosis not present

## 2021-02-17 DIAGNOSIS — F410 Panic disorder [episodic paroxysmal anxiety] without agoraphobia: Secondary | ICD-10-CM | POA: Diagnosis not present

## 2021-03-11 DIAGNOSIS — E11638 Type 2 diabetes mellitus with other oral complications: Secondary | ICD-10-CM | POA: Diagnosis not present

## 2021-03-11 DIAGNOSIS — F32A Depression, unspecified: Secondary | ICD-10-CM | POA: Diagnosis not present

## 2021-03-11 DIAGNOSIS — M7918 Myalgia, other site: Secondary | ICD-10-CM | POA: Diagnosis not present

## 2021-03-11 DIAGNOSIS — I10 Essential (primary) hypertension: Secondary | ICD-10-CM | POA: Diagnosis not present

## 2021-03-11 DIAGNOSIS — E785 Hyperlipidemia, unspecified: Secondary | ICD-10-CM | POA: Diagnosis not present

## 2021-03-11 DIAGNOSIS — F17210 Nicotine dependence, cigarettes, uncomplicated: Secondary | ICD-10-CM | POA: Diagnosis not present

## 2021-03-11 DIAGNOSIS — Z91010 Allergy to peanuts: Secondary | ICD-10-CM | POA: Diagnosis not present

## 2021-03-11 DIAGNOSIS — M659 Synovitis and tenosynovitis, unspecified: Secondary | ICD-10-CM | POA: Diagnosis not present

## 2021-03-11 DIAGNOSIS — Z7984 Long term (current) use of oral hypoglycemic drugs: Secondary | ICD-10-CM | POA: Diagnosis not present

## 2021-03-11 DIAGNOSIS — F419 Anxiety disorder, unspecified: Secondary | ICD-10-CM | POA: Diagnosis not present

## 2021-03-11 DIAGNOSIS — M26623 Arthralgia of bilateral temporomandibular joint: Secondary | ICD-10-CM | POA: Diagnosis not present

## 2021-03-11 DIAGNOSIS — Z79899 Other long term (current) drug therapy: Secondary | ICD-10-CM | POA: Diagnosis not present

## 2021-03-11 DIAGNOSIS — Z91040 Latex allergy status: Secondary | ICD-10-CM | POA: Diagnosis not present

## 2021-03-11 DIAGNOSIS — M1990 Unspecified osteoarthritis, unspecified site: Secondary | ICD-10-CM | POA: Diagnosis not present

## 2021-04-05 DIAGNOSIS — H259 Unspecified age-related cataract: Secondary | ICD-10-CM | POA: Diagnosis not present

## 2021-04-05 DIAGNOSIS — H402233 Chronic angle-closure glaucoma, bilateral, severe stage: Secondary | ICD-10-CM | POA: Diagnosis not present

## 2021-04-05 DIAGNOSIS — H26493 Other secondary cataract, bilateral: Secondary | ICD-10-CM | POA: Diagnosis not present

## 2021-05-06 DIAGNOSIS — M26623 Arthralgia of bilateral temporomandibular joint: Secondary | ICD-10-CM | POA: Diagnosis not present

## 2021-05-06 DIAGNOSIS — M7918 Myalgia, other site: Secondary | ICD-10-CM | POA: Diagnosis not present

## 2021-06-09 DIAGNOSIS — F3181 Bipolar II disorder: Secondary | ICD-10-CM | POA: Diagnosis not present

## 2021-07-21 DIAGNOSIS — F251 Schizoaffective disorder, depressive type: Secondary | ICD-10-CM | POA: Diagnosis not present

## 2021-07-21 DIAGNOSIS — F411 Generalized anxiety disorder: Secondary | ICD-10-CM | POA: Diagnosis not present

## 2021-10-08 DIAGNOSIS — N1831 Chronic kidney disease, stage 3a: Secondary | ICD-10-CM | POA: Diagnosis not present

## 2021-10-08 DIAGNOSIS — Z1231 Encounter for screening mammogram for malignant neoplasm of breast: Secondary | ICD-10-CM | POA: Diagnosis not present

## 2021-10-08 DIAGNOSIS — Z8739 Personal history of other diseases of the musculoskeletal system and connective tissue: Secondary | ICD-10-CM | POA: Diagnosis not present

## 2021-10-08 DIAGNOSIS — Z0000 Encounter for general adult medical examination without abnormal findings: Secondary | ICD-10-CM | POA: Diagnosis not present

## 2021-10-08 DIAGNOSIS — R635 Abnormal weight gain: Secondary | ICD-10-CM | POA: Diagnosis not present

## 2021-10-08 DIAGNOSIS — Z862 Personal history of diseases of the blood and blood-forming organs and certain disorders involving the immune mechanism: Secondary | ICD-10-CM | POA: Diagnosis not present

## 2021-10-08 DIAGNOSIS — N281 Cyst of kidney, acquired: Secondary | ICD-10-CM | POA: Diagnosis not present

## 2021-10-08 DIAGNOSIS — F324 Major depressive disorder, single episode, in partial remission: Secondary | ICD-10-CM | POA: Diagnosis not present

## 2021-10-08 DIAGNOSIS — I129 Hypertensive chronic kidney disease with stage 1 through stage 4 chronic kidney disease, or unspecified chronic kidney disease: Secondary | ICD-10-CM | POA: Diagnosis not present

## 2021-10-08 DIAGNOSIS — F17200 Nicotine dependence, unspecified, uncomplicated: Secondary | ICD-10-CM | POA: Diagnosis not present

## 2021-10-08 DIAGNOSIS — I10 Essential (primary) hypertension: Secondary | ICD-10-CM | POA: Diagnosis not present

## 2021-10-08 DIAGNOSIS — E113299 Type 2 diabetes mellitus with mild nonproliferative diabetic retinopathy without macular edema, unspecified eye: Secondary | ICD-10-CM | POA: Diagnosis not present

## 2021-10-08 DIAGNOSIS — R911 Solitary pulmonary nodule: Secondary | ICD-10-CM | POA: Diagnosis not present

## 2021-10-08 DIAGNOSIS — D649 Anemia, unspecified: Secondary | ICD-10-CM | POA: Diagnosis not present

## 2021-10-08 DIAGNOSIS — M7918 Myalgia, other site: Secondary | ICD-10-CM | POA: Diagnosis not present

## 2021-10-08 DIAGNOSIS — E785 Hyperlipidemia, unspecified: Secondary | ICD-10-CM | POA: Diagnosis not present

## 2021-10-08 DIAGNOSIS — E1122 Type 2 diabetes mellitus with diabetic chronic kidney disease: Secondary | ICD-10-CM | POA: Diagnosis not present

## 2021-10-08 DIAGNOSIS — F17210 Nicotine dependence, cigarettes, uncomplicated: Secondary | ICD-10-CM | POA: Diagnosis not present

## 2021-10-08 DIAGNOSIS — K635 Polyp of colon: Secondary | ICD-10-CM | POA: Diagnosis not present

## 2021-10-08 DIAGNOSIS — J449 Chronic obstructive pulmonary disease, unspecified: Secondary | ICD-10-CM | POA: Diagnosis not present

## 2021-11-01 DIAGNOSIS — Z1231 Encounter for screening mammogram for malignant neoplasm of breast: Secondary | ICD-10-CM | POA: Diagnosis not present

## 2021-11-01 DIAGNOSIS — Z87891 Personal history of nicotine dependence: Secondary | ICD-10-CM | POA: Diagnosis not present

## 2021-12-08 DIAGNOSIS — F310 Bipolar disorder, current episode hypomanic: Secondary | ICD-10-CM | POA: Diagnosis not present

## 2021-12-08 DIAGNOSIS — F411 Generalized anxiety disorder: Secondary | ICD-10-CM | POA: Diagnosis not present

## 2022-01-12 DIAGNOSIS — H402233 Chronic angle-closure glaucoma, bilateral, severe stage: Secondary | ICD-10-CM | POA: Diagnosis not present

## 2022-01-12 DIAGNOSIS — Z961 Presence of intraocular lens: Secondary | ICD-10-CM | POA: Diagnosis not present

## 2022-01-12 DIAGNOSIS — H26493 Other secondary cataract, bilateral: Secondary | ICD-10-CM | POA: Diagnosis not present

## 2022-01-26 DIAGNOSIS — F310 Bipolar disorder, current episode hypomanic: Secondary | ICD-10-CM | POA: Diagnosis not present

## 2022-02-09 DIAGNOSIS — Z1211 Encounter for screening for malignant neoplasm of colon: Secondary | ICD-10-CM | POA: Diagnosis not present

## 2022-02-09 DIAGNOSIS — K5730 Diverticulosis of large intestine without perforation or abscess without bleeding: Secondary | ICD-10-CM | POA: Diagnosis not present

## 2022-02-09 DIAGNOSIS — Z86010 Personal history of colonic polyps: Secondary | ICD-10-CM | POA: Diagnosis not present

## 2022-02-09 DIAGNOSIS — D127 Benign neoplasm of rectosigmoid junction: Secondary | ICD-10-CM | POA: Diagnosis not present

## 2022-02-09 DIAGNOSIS — K635 Polyp of colon: Secondary | ICD-10-CM | POA: Diagnosis not present

## 2022-03-02 DIAGNOSIS — Z7985 Long-term (current) use of injectable non-insulin antidiabetic drugs: Secondary | ICD-10-CM | POA: Diagnosis not present

## 2022-03-02 DIAGNOSIS — E113299 Type 2 diabetes mellitus with mild nonproliferative diabetic retinopathy without macular edema, unspecified eye: Secondary | ICD-10-CM | POA: Diagnosis not present

## 2022-03-02 DIAGNOSIS — E785 Hyperlipidemia, unspecified: Secondary | ICD-10-CM | POA: Diagnosis not present

## 2022-03-02 DIAGNOSIS — N1831 Chronic kidney disease, stage 3a: Secondary | ICD-10-CM | POA: Diagnosis not present

## 2022-03-02 DIAGNOSIS — I129 Hypertensive chronic kidney disease with stage 1 through stage 4 chronic kidney disease, or unspecified chronic kidney disease: Secondary | ICD-10-CM | POA: Diagnosis not present

## 2022-03-02 DIAGNOSIS — B3731 Acute candidiasis of vulva and vagina: Secondary | ICD-10-CM | POA: Diagnosis not present

## 2022-03-16 DIAGNOSIS — F3160 Bipolar disorder, current episode mixed, unspecified: Secondary | ICD-10-CM | POA: Diagnosis not present

## 2022-03-16 DIAGNOSIS — F4001 Agoraphobia with panic disorder: Secondary | ICD-10-CM | POA: Diagnosis not present

## 2022-04-13 DIAGNOSIS — H402233 Chronic angle-closure glaucoma, bilateral, severe stage: Secondary | ICD-10-CM | POA: Diagnosis not present

## 2022-04-27 DIAGNOSIS — F411 Generalized anxiety disorder: Secondary | ICD-10-CM | POA: Diagnosis not present

## 2022-04-27 DIAGNOSIS — F331 Major depressive disorder, recurrent, moderate: Secondary | ICD-10-CM | POA: Diagnosis not present

## 2022-05-11 DIAGNOSIS — H402233 Chronic angle-closure glaucoma, bilateral, severe stage: Secondary | ICD-10-CM | POA: Diagnosis not present

## 2022-05-26 DIAGNOSIS — M48062 Spinal stenosis, lumbar region with neurogenic claudication: Secondary | ICD-10-CM | POA: Diagnosis not present

## 2022-06-01 DIAGNOSIS — E113299 Type 2 diabetes mellitus with mild nonproliferative diabetic retinopathy without macular edema, unspecified eye: Secondary | ICD-10-CM | POA: Diagnosis not present

## 2022-06-01 DIAGNOSIS — F324 Major depressive disorder, single episode, in partial remission: Secondary | ICD-10-CM | POA: Diagnosis not present

## 2022-06-01 DIAGNOSIS — N1831 Chronic kidney disease, stage 3a: Secondary | ICD-10-CM | POA: Diagnosis not present

## 2022-06-01 DIAGNOSIS — I10 Essential (primary) hypertension: Secondary | ICD-10-CM | POA: Diagnosis not present

## 2022-06-01 DIAGNOSIS — D649 Anemia, unspecified: Secondary | ICD-10-CM | POA: Diagnosis not present

## 2022-07-13 DIAGNOSIS — F251 Schizoaffective disorder, depressive type: Secondary | ICD-10-CM | POA: Diagnosis not present

## 2022-08-24 DIAGNOSIS — G251 Drug-induced tremor: Secondary | ICD-10-CM | POA: Diagnosis not present

## 2022-08-24 DIAGNOSIS — E1142 Type 2 diabetes mellitus with diabetic polyneuropathy: Secondary | ICD-10-CM | POA: Diagnosis not present

## 2022-08-24 DIAGNOSIS — G2401 Drug induced subacute dyskinesia: Secondary | ICD-10-CM | POA: Diagnosis not present

## 2022-08-24 DIAGNOSIS — M48062 Spinal stenosis, lumbar region with neurogenic claudication: Secondary | ICD-10-CM | POA: Diagnosis not present

## 2022-09-21 DIAGNOSIS — I10 Essential (primary) hypertension: Secondary | ICD-10-CM | POA: Diagnosis not present

## 2022-09-21 DIAGNOSIS — E119 Type 2 diabetes mellitus without complications: Secondary | ICD-10-CM | POA: Diagnosis not present

## 2022-09-21 DIAGNOSIS — L299 Pruritus, unspecified: Secondary | ICD-10-CM | POA: Diagnosis not present

## 2022-09-21 DIAGNOSIS — M47812 Spondylosis without myelopathy or radiculopathy, cervical region: Secondary | ICD-10-CM | POA: Diagnosis not present

## 2022-09-21 DIAGNOSIS — M542 Cervicalgia: Secondary | ICD-10-CM | POA: Diagnosis not present

## 2022-09-21 DIAGNOSIS — Z5189 Encounter for other specified aftercare: Secondary | ICD-10-CM | POA: Diagnosis not present

## 2022-09-21 DIAGNOSIS — H16429 Pannus (corneal), unspecified eye: Secondary | ICD-10-CM | POA: Diagnosis not present

## 2022-10-07 DIAGNOSIS — M6281 Muscle weakness (generalized): Secondary | ICD-10-CM | POA: Diagnosis not present

## 2022-10-07 DIAGNOSIS — M799 Soft tissue disorder, unspecified: Secondary | ICD-10-CM | POA: Diagnosis not present

## 2022-10-07 DIAGNOSIS — M542 Cervicalgia: Secondary | ICD-10-CM | POA: Diagnosis not present

## 2022-10-13 DIAGNOSIS — M799 Soft tissue disorder, unspecified: Secondary | ICD-10-CM | POA: Diagnosis not present

## 2022-10-13 DIAGNOSIS — M6281 Muscle weakness (generalized): Secondary | ICD-10-CM | POA: Diagnosis not present

## 2022-10-13 DIAGNOSIS — M542 Cervicalgia: Secondary | ICD-10-CM | POA: Diagnosis not present

## 2022-10-18 DIAGNOSIS — M542 Cervicalgia: Secondary | ICD-10-CM | POA: Diagnosis not present

## 2022-10-18 DIAGNOSIS — M6281 Muscle weakness (generalized): Secondary | ICD-10-CM | POA: Diagnosis not present

## 2022-10-18 DIAGNOSIS — M799 Soft tissue disorder, unspecified: Secondary | ICD-10-CM | POA: Diagnosis not present

## 2022-10-20 DIAGNOSIS — M799 Soft tissue disorder, unspecified: Secondary | ICD-10-CM | POA: Diagnosis not present

## 2022-10-20 DIAGNOSIS — M6281 Muscle weakness (generalized): Secondary | ICD-10-CM | POA: Diagnosis not present

## 2022-10-20 DIAGNOSIS — M542 Cervicalgia: Secondary | ICD-10-CM | POA: Diagnosis not present

## 2022-10-24 DIAGNOSIS — H402233 Chronic angle-closure glaucoma, bilateral, severe stage: Secondary | ICD-10-CM | POA: Diagnosis not present

## 2022-10-25 DIAGNOSIS — M542 Cervicalgia: Secondary | ICD-10-CM | POA: Diagnosis not present

## 2022-10-25 DIAGNOSIS — M799 Soft tissue disorder, unspecified: Secondary | ICD-10-CM | POA: Diagnosis not present

## 2022-10-25 DIAGNOSIS — M6281 Muscle weakness (generalized): Secondary | ICD-10-CM | POA: Diagnosis not present

## 2022-10-27 DIAGNOSIS — M799 Soft tissue disorder, unspecified: Secondary | ICD-10-CM | POA: Diagnosis not present

## 2022-10-27 DIAGNOSIS — M542 Cervicalgia: Secondary | ICD-10-CM | POA: Diagnosis not present

## 2022-10-27 DIAGNOSIS — M6281 Muscle weakness (generalized): Secondary | ICD-10-CM | POA: Diagnosis not present

## 2022-11-08 DIAGNOSIS — Z1231 Encounter for screening mammogram for malignant neoplasm of breast: Secondary | ICD-10-CM | POA: Diagnosis not present

## 2022-11-16 DIAGNOSIS — H402233 Chronic angle-closure glaucoma, bilateral, severe stage: Secondary | ICD-10-CM | POA: Diagnosis not present

## 2022-11-16 DIAGNOSIS — F332 Major depressive disorder, recurrent severe without psychotic features: Secondary | ICD-10-CM | POA: Diagnosis not present

## 2022-11-16 DIAGNOSIS — F411 Generalized anxiety disorder: Secondary | ICD-10-CM | POA: Diagnosis not present

## 2023-01-18 DIAGNOSIS — H402233 Chronic angle-closure glaucoma, bilateral, severe stage: Secondary | ICD-10-CM | POA: Diagnosis not present

## 2023-01-25 DIAGNOSIS — F333 Major depressive disorder, recurrent, severe with psychotic symptoms: Secondary | ICD-10-CM | POA: Diagnosis not present

## 2023-01-25 DIAGNOSIS — F411 Generalized anxiety disorder: Secondary | ICD-10-CM | POA: Diagnosis not present

## 2023-04-08 DIAGNOSIS — F332 Major depressive disorder, recurrent severe without psychotic features: Secondary | ICD-10-CM | POA: Diagnosis not present

## 2023-04-24 DIAGNOSIS — Z961 Presence of intraocular lens: Secondary | ICD-10-CM | POA: Diagnosis not present

## 2023-04-24 DIAGNOSIS — H402233 Chronic angle-closure glaucoma, bilateral, severe stage: Secondary | ICD-10-CM | POA: Diagnosis not present

## 2023-06-13 DIAGNOSIS — I10 Essential (primary) hypertension: Secondary | ICD-10-CM | POA: Diagnosis not present

## 2023-06-13 DIAGNOSIS — N189 Chronic kidney disease, unspecified: Secondary | ICD-10-CM | POA: Diagnosis not present

## 2023-06-13 DIAGNOSIS — R0602 Shortness of breath: Secondary | ICD-10-CM | POA: Diagnosis not present

## 2023-06-13 DIAGNOSIS — Z13220 Encounter for screening for lipoid disorders: Secondary | ICD-10-CM | POA: Diagnosis not present

## 2023-06-13 DIAGNOSIS — R4189 Other symptoms and signs involving cognitive functions and awareness: Secondary | ICD-10-CM | POA: Diagnosis not present

## 2023-06-13 DIAGNOSIS — M48061 Spinal stenosis, lumbar region without neurogenic claudication: Secondary | ICD-10-CM | POA: Diagnosis not present

## 2023-06-13 DIAGNOSIS — Z8659 Personal history of other mental and behavioral disorders: Secondary | ICD-10-CM | POA: Diagnosis not present

## 2023-06-13 DIAGNOSIS — N1831 Chronic kidney disease, stage 3a: Secondary | ICD-10-CM | POA: Diagnosis not present

## 2023-06-13 DIAGNOSIS — I129 Hypertensive chronic kidney disease with stage 1 through stage 4 chronic kidney disease, or unspecified chronic kidney disease: Secondary | ICD-10-CM | POA: Diagnosis not present

## 2023-06-13 DIAGNOSIS — Z7984 Long term (current) use of oral hypoglycemic drugs: Secondary | ICD-10-CM | POA: Diagnosis not present

## 2023-06-13 DIAGNOSIS — E1122 Type 2 diabetes mellitus with diabetic chronic kidney disease: Secondary | ICD-10-CM | POA: Diagnosis not present

## 2023-06-13 DIAGNOSIS — R32 Unspecified urinary incontinence: Secondary | ICD-10-CM | POA: Diagnosis not present

## 2023-06-13 DIAGNOSIS — D649 Anemia, unspecified: Secondary | ICD-10-CM | POA: Diagnosis not present

## 2023-06-13 DIAGNOSIS — Z0000 Encounter for general adult medical examination without abnormal findings: Secondary | ICD-10-CM | POA: Diagnosis not present

## 2023-06-13 DIAGNOSIS — F17210 Nicotine dependence, cigarettes, uncomplicated: Secondary | ICD-10-CM | POA: Diagnosis not present

## 2023-06-13 DIAGNOSIS — R911 Solitary pulmonary nodule: Secondary | ICD-10-CM | POA: Diagnosis not present

## 2023-06-13 DIAGNOSIS — E113299 Type 2 diabetes mellitus with mild nonproliferative diabetic retinopathy without macular edema, unspecified eye: Secondary | ICD-10-CM | POA: Diagnosis not present

## 2023-06-13 DIAGNOSIS — H409 Unspecified glaucoma: Secondary | ICD-10-CM | POA: Diagnosis not present

## 2023-06-13 DIAGNOSIS — N3941 Urge incontinence: Secondary | ICD-10-CM | POA: Diagnosis not present

## 2023-06-30 DIAGNOSIS — Z961 Presence of intraocular lens: Secondary | ICD-10-CM | POA: Diagnosis not present

## 2023-06-30 DIAGNOSIS — H402233 Chronic angle-closure glaucoma, bilateral, severe stage: Secondary | ICD-10-CM | POA: Diagnosis not present

## 2023-07-06 DIAGNOSIS — F17210 Nicotine dependence, cigarettes, uncomplicated: Secondary | ICD-10-CM | POA: Diagnosis not present

## 2023-07-06 DIAGNOSIS — Z780 Asymptomatic menopausal state: Secondary | ICD-10-CM | POA: Diagnosis not present

## 2023-07-06 DIAGNOSIS — Z13820 Encounter for screening for osteoporosis: Secondary | ICD-10-CM | POA: Diagnosis not present

## 2023-07-11 DIAGNOSIS — I10 Essential (primary) hypertension: Secondary | ICD-10-CM | POA: Diagnosis not present

## 2023-07-11 DIAGNOSIS — R809 Proteinuria, unspecified: Secondary | ICD-10-CM | POA: Diagnosis not present

## 2023-07-11 DIAGNOSIS — E113299 Type 2 diabetes mellitus with mild nonproliferative diabetic retinopathy without macular edema, unspecified eye: Secondary | ICD-10-CM | POA: Diagnosis not present

## 2023-07-12 DIAGNOSIS — I440 Atrioventricular block, first degree: Secondary | ICD-10-CM | POA: Diagnosis not present

## 2023-07-12 DIAGNOSIS — I10 Essential (primary) hypertension: Secondary | ICD-10-CM | POA: Diagnosis not present

## 2023-07-12 DIAGNOSIS — I4510 Unspecified right bundle-branch block: Secondary | ICD-10-CM | POA: Diagnosis not present

## 2023-07-12 DIAGNOSIS — R001 Bradycardia, unspecified: Secondary | ICD-10-CM | POA: Diagnosis not present

## 2023-07-15 DIAGNOSIS — F411 Generalized anxiety disorder: Secondary | ICD-10-CM | POA: Diagnosis not present

## 2023-07-15 DIAGNOSIS — F29 Unspecified psychosis not due to a substance or known physiological condition: Secondary | ICD-10-CM | POA: Diagnosis not present

## 2023-07-25 DIAGNOSIS — M4802 Spinal stenosis, cervical region: Secondary | ICD-10-CM | POA: Diagnosis not present

## 2023-07-25 DIAGNOSIS — M4712 Other spondylosis with myelopathy, cervical region: Secondary | ICD-10-CM | POA: Diagnosis not present

## 2023-08-24 DIAGNOSIS — R110 Nausea: Secondary | ICD-10-CM | POA: Diagnosis not present

## 2023-08-24 DIAGNOSIS — Z7984 Long term (current) use of oral hypoglycemic drugs: Secondary | ICD-10-CM | POA: Diagnosis not present

## 2023-08-24 DIAGNOSIS — E113299 Type 2 diabetes mellitus with mild nonproliferative diabetic retinopathy without macular edema, unspecified eye: Secondary | ICD-10-CM | POA: Diagnosis not present

## 2023-08-24 DIAGNOSIS — E1165 Type 2 diabetes mellitus with hyperglycemia: Secondary | ICD-10-CM | POA: Diagnosis not present

## 2023-08-24 DIAGNOSIS — R809 Proteinuria, unspecified: Secondary | ICD-10-CM | POA: Diagnosis not present

## 2023-08-24 DIAGNOSIS — T50995D Adverse effect of other drugs, medicaments and biological substances, subsequent encounter: Secondary | ICD-10-CM | POA: Diagnosis not present

## 2023-09-14 DIAGNOSIS — E1165 Type 2 diabetes mellitus with hyperglycemia: Secondary | ICD-10-CM | POA: Diagnosis not present

## 2023-09-26 DIAGNOSIS — R0683 Snoring: Secondary | ICD-10-CM | POA: Diagnosis not present

## 2023-09-26 DIAGNOSIS — R0681 Apnea, not elsewhere classified: Secondary | ICD-10-CM | POA: Diagnosis not present

## 2023-09-26 DIAGNOSIS — G478 Other sleep disorders: Secondary | ICD-10-CM | POA: Diagnosis not present

## 2023-09-26 DIAGNOSIS — G4710 Hypersomnia, unspecified: Secondary | ICD-10-CM | POA: Diagnosis not present

## 2023-09-26 DIAGNOSIS — I10 Essential (primary) hypertension: Secondary | ICD-10-CM | POA: Diagnosis not present

## 2023-09-26 DIAGNOSIS — R0689 Other abnormalities of breathing: Secondary | ICD-10-CM | POA: Diagnosis not present

## 2023-10-02 DIAGNOSIS — H402233 Chronic angle-closure glaucoma, bilateral, severe stage: Secondary | ICD-10-CM | POA: Diagnosis not present

## 2023-10-02 DIAGNOSIS — Z961 Presence of intraocular lens: Secondary | ICD-10-CM | POA: Diagnosis not present

## 2023-10-02 DIAGNOSIS — E119 Type 2 diabetes mellitus without complications: Secondary | ICD-10-CM | POA: Diagnosis not present

## 2023-10-04 DIAGNOSIS — Z794 Long term (current) use of insulin: Secondary | ICD-10-CM | POA: Diagnosis not present

## 2023-10-04 DIAGNOSIS — E11319 Type 2 diabetes mellitus with unspecified diabetic retinopathy without macular edema: Secondary | ICD-10-CM | POA: Diagnosis not present
